# Patient Record
Sex: Female | Born: 2000
Health system: Southern US, Community
[De-identification: ages and names within clinical notes are randomized; demographics above are authoritative.]

## PROBLEM LIST (undated history)

## (undated) DIAGNOSIS — K5900 Constipation, unspecified: Secondary | ICD-10-CM

## (undated) DIAGNOSIS — F419 Anxiety disorder, unspecified: Secondary | ICD-10-CM

## (undated) DIAGNOSIS — N920 Excessive and frequent menstruation with regular cycle: Secondary | ICD-10-CM

## (undated) HISTORY — DX: Constipation, unspecified: K59.00

## (undated) HISTORY — DX: Excessive and frequent menstruation with regular cycle: N92.0

## (undated) HISTORY — DX: Anxiety disorder, unspecified: F41.9

---
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
## 2021-01-03 ENCOUNTER — Ambulatory Visit: Payer: PRIVATE HEALTH INSURANCE

## 2021-01-05 ENCOUNTER — Other Ambulatory Visit: Payer: Self-pay

## 2021-01-05 MED ORDER — FLUOXETINE HCL 20 MG PO CAPS: 20.0000 mg | ORAL_CAPSULE | Freq: Every day | ORAL | 0 refills | Status: AC

## 2021-01-10 ENCOUNTER — Other Ambulatory Visit: Payer: Self-pay

## 2021-01-10 ENCOUNTER — Ambulatory Visit (INDEPENDENT_AMBULATORY_CARE_PROVIDER_SITE_OTHER): Payer: PRIVATE HEALTH INSURANCE

## 2021-01-10 DIAGNOSIS — Z3042 Encounter for surveillance of injectable contraceptive: Secondary | ICD-10-CM

## 2021-01-10 MED ORDER — MEDROXYPROGESTERONE ACETATE 150 MG/ML IM SUSP
150.0000 mg | Freq: Once | INTRAMUSCULAR | Status: AC
  Administered 2021-01-10: 150 mg via INTRAMUSCULAR

## 2021-01-10 NOTE — Progress Notes (Signed)
Pt presents for depo injection. Pt within depo window, no urine hcg needed. Injection given, tolerated well. F/u depo injection visit scheduled.   

## 2021-03-28 ENCOUNTER — Ambulatory Visit: Payer: PRIVATE HEALTH INSURANCE
